# Patient Record
Sex: Female | Born: 1975
Health system: Southern US, Community
[De-identification: ages and names within clinical notes are randomized; demographics above are authoritative.]

## PROBLEM LIST (undated history)

## (undated) ENCOUNTER — Inpatient Hospital Stay (HOSPITAL_COMMUNITY): Payer: Self-pay

## (undated) DIAGNOSIS — D229 Melanocytic nevi, unspecified: Secondary | ICD-10-CM

## (undated) HISTORY — DX: Melanocytic nevi, unspecified: D22.9

---

## 2012-04-11 ENCOUNTER — Ambulatory Visit (INDEPENDENT_AMBULATORY_CARE_PROVIDER_SITE_OTHER): Payer: BC Managed Care – PPO | Admitting: Family Medicine

## 2012-04-11 VITALS — BP 120/80 | HR 84 | Temp 98.5°F | Resp 16 | Ht 62.0 in | Wt 173.0 lb

## 2012-04-11 DIAGNOSIS — R002 Palpitations: Secondary | ICD-10-CM

## 2012-04-11 DIAGNOSIS — R079 Chest pain, unspecified: Secondary | ICD-10-CM

## 2012-04-11 DIAGNOSIS — K219 Gastro-esophageal reflux disease without esophagitis: Secondary | ICD-10-CM

## 2012-04-11 LAB — COMPREHENSIVE METABOLIC PANEL WITH GFR
ALT: 15 U/L (ref 0–35)
CO2: 25 meq/L (ref 19–32)
Calcium: 9.8 mg/dL (ref 8.4–10.5)
Chloride: 102 meq/L (ref 96–112)
Sodium: 137 meq/L (ref 135–145)
Total Protein: 7.6 g/dL (ref 6.0–8.3)

## 2012-04-11 LAB — POCT CBC
Granulocyte percent: 80.5 %G — AB (ref 37–80)
HCT, POC: 46 % (ref 37.7–47.9)
Hemoglobin: 14.4 g/dL (ref 12.2–16.2)
Lymph, poc: 1.5 (ref 0.6–3.4)
MCH, POC: 30.4 pg (ref 27–31.2)
MCHC: 31.3 g/dL — AB (ref 31.8–35.4)
MCV: 97.2 fL — AB (ref 80–97)
MID (cbc): 0.4 (ref 0–0.9)
MPV: 8.4 fL (ref 0–99.8)
POC Granulocyte: 7.6 — AB (ref 2–6.9)
POC LYMPH PERCENT: 15.6 %L (ref 10–50)
POC MID %: 3.9 % (ref 0–12)
Platelet Count, POC: 301 10*3/uL (ref 142–424)
RBC: 4.73 M/uL (ref 4.04–5.48)
RDW, POC: 12.3 %
WBC: 9.5 10*3/uL (ref 4.6–10.2)

## 2012-04-11 LAB — COMPREHENSIVE METABOLIC PANEL
AST: 17 U/L (ref 0–37)
Albumin: 4.4 g/dL (ref 3.5–5.2)
Alkaline Phosphatase: 59 U/L (ref 39–117)
BUN: 9 mg/dL (ref 6–23)
Creat: 0.54 mg/dL (ref 0.50–1.10)
Glucose, Bld: 134 mg/dL — ABNORMAL HIGH (ref 70–99)
Potassium: 3.9 mEq/L (ref 3.5–5.3)
Total Bilirubin: 0.6 mg/dL (ref 0.3–1.2)

## 2012-04-11 MED ORDER — GI COCKTAIL ~~LOC~~
30.0000 mL | Freq: Once | ORAL | Status: AC
  Administered 2012-04-11: 30 mL via ORAL

## 2012-04-11 NOTE — Progress Notes (Signed)
[facility]:  Office Visit  Chief Complaint:  Chief Complaint  Patient presents with  . Heartburn    x 1 day  . Chest Pain    in the center of the chest  . Diarrhea    HPI: Jasmin Murphy is a 37 y.o. female who complains of  1 day history of stabbing pain mid chest, took zantac without relief. Is different then regular heart burn. Took peptobismol. Went to bed then couldn't lay down , took 3 tums. Went to bed at 2 AM. Had a burning sensation in [place] this AM when woke up. Gets worse and then better. Took nexium around noon. Only ate bland things. Had tacos last night, drank coffee in the afternoon, 2nd cup of the day which is not normal for her. Had palpitations and burning sensation going down body to feet and then had to go to have a bowel movement. Denies numbness, tingling. Denies nausea, vomiting, HA, vision changes. Denies any HTN, XOL, had gestational diabetes. Maternal grandfather with MI at age 19.   History reviewed. No pertinent past medical history. Past Surgical History  Procedure Date  . Cesarean section    History   Social History  . Marital Status: Married    Spouse Name: N/A    Number of Children: N/A  . Years of Education: N/A   Social History Main Topics  . Smoking status: Never Smoker   . Smokeless tobacco: None  . Alcohol Use: None  . Drug Use: None  . Sexually Active: None   Other Topics Concern  . None   Social History Narrative  . None   History reviewed. No pertinent family history. No Known Allergies Prior to Admission medications   Medication Sig Start Date End Date Taking? Authorizing Provider  PRENATAL VITAMINS PO Take by mouth.   Yes Historical Provider, MD  ranitidine (ZANTAC) 75 MG tablet Take 75 mg by mouth 2 (two) times daily.   Yes Historical Provider, MD     ROS: The patient denies fevers, chills, night sweats, unintentional weight loss, wheezing, dyspnea on exertion, nausea, vomiting, abdominal pain,  dysuria, hematuria, melena, numbness, weakness, or tingling.   All other systems have been reviewed and were otherwise negative with the exception of those mentioned in the HPI and as above.    PHYSICAL EXAM: Filed Vitals:   04/11/12 1450  BP: 120/80  Pulse: 84  Temp: 98.5 F (36.9 C)  Resp: 16   Filed Vitals:   04/11/12 1450  Height: 5\' 2"  (1.575 m)  Weight: 173 lb (78.472 kg)   Body mass index is 31.64 kg/(m^2).  General: Alert, no acute distress HEENT:  Normocephalic, atraumatic, oropharynx patent.  Cardiovascular:  Regular rate and rhythm, no rubs murmurs or gallops.  No Carotid bruits, radial pulse intact. No pedal edema.  Respiratory: Clear to auscultation bilaterally.  No wheezes, rales, or rhonchi.  No cyanosis, no use of accessory musculature GI: No organomegaly, abdomen is soft and non-tender, positive bowel sounds.  No masses. Skin: No rashes. Neurologic: Facial musculature symmetric. Psychiatric: Patient is appropriate throughout our interaction. Lymphatic: No cervical lymphadenopathy Musculoskeletal: Gait intact.   LABS: Results for orders placed in visit on 04/11/12  POCT CBC      Component Value Range   WBC 9.5  4.6 - 10.2 K/uL   Lymph, poc 1.5  0.6 - 3.4   POC LYMPH PERCENT 15.6  10 - 50 %L   MID (cbc) 0.4  0 - 0.9  POC MID % 3.9  0 - 12 %M   POC Granulocyte 7.6 (*) 2 - 6.9   Granulocyte percent 80.5 (*) 37 - 80 %G   RBC 4.73  4.04 - 5.48 M/uL   Hemoglobin 14.4  12.2 - 16.2 g/dL   HCT, POC 16.1  09.6 - 47.9 %   MCV 97.2 (*) 80 - 97 fL   MCH, POC 30.4  27 - 31.2 pg   MCHC 31.3 (*) 31.8 - 35.4 g/dL   RDW, POC 04.5     Platelet Count, POC 301  142 - 424 K/uL   MPV 8.4  0 - 99.8 fL     EKG/XRAY:   Primary read interpreted by Dr. Conley Rolls at [facility]. EKG NSR at 70 bpm   ASSESSMENT/PLAN: Encounter Diagnoses  Name Primary?  . Chest pain Yes  . Palpitations   . GERD (gastroesophageal reflux disease)    Midsubsternal CP most c/w GERD. Was given GI  cocktail x 1 and felt better. But it was getting better at the time anyways, possibly due to nexium that she took at noon.  Rx Prilosec 20 mg and Zantac prn Avoid GERD causes F/u prn Go to ER if worsening sxs, CP, SOB    Cathan Gearin PHUONG, DO 04/11/2012 3:47 PM

## 2012-12-18 ENCOUNTER — Inpatient Hospital Stay (HOSPITAL_COMMUNITY): Payer: BC Managed Care – PPO

## 2012-12-18 ENCOUNTER — Inpatient Hospital Stay (HOSPITAL_COMMUNITY)
Admission: AD | Admit: 2012-12-18 | Discharge: 2012-12-18 | Disposition: A | Discharge status: Home / Self Care | Payer: BC Managed Care – PPO | Source: Ambulatory Visit | Attending: Obstetrics & Gynecology | Admitting: Obstetrics & Gynecology

## 2012-12-18 ENCOUNTER — Encounter (HOSPITAL_COMMUNITY): Payer: Self-pay

## 2012-12-18 DIAGNOSIS — O009 Unspecified ectopic pregnancy without intrauterine pregnancy: Secondary | ICD-10-CM

## 2012-12-18 DIAGNOSIS — R109 Unspecified abdominal pain: Secondary | ICD-10-CM | POA: Insufficient documentation

## 2012-12-18 DIAGNOSIS — M545 Low back pain, unspecified: Secondary | ICD-10-CM | POA: Insufficient documentation

## 2012-12-18 DIAGNOSIS — O00109 Unspecified tubal pregnancy without intrauterine pregnancy: Secondary | ICD-10-CM | POA: Insufficient documentation

## 2012-12-18 LAB — URINE MICROSCOPIC-ADD ON

## 2012-12-18 LAB — WET PREP, GENITAL
Clue Cells Wet Prep HPF POC: NONE SEEN
Trich, Wet Prep: NONE SEEN
Yeast Wet Prep HPF POC: NONE SEEN

## 2012-12-18 LAB — URINALYSIS, ROUTINE W REFLEX MICROSCOPIC
Bilirubin Urine: NEGATIVE
Nitrite: NEGATIVE
Specific Gravity, Urine: >1.03 — ABNORMAL HIGH (ref 1.005–1.030)
Urobilinogen, UA: 0.2 mg/dL (ref 0.0–1.0)
pH: 5.5 (ref 5.0–8.0)

## 2012-12-18 LAB — COMPREHENSIVE METABOLIC PANEL
ALT: 10 U/L (ref 0–35)
AST: 14 U/L (ref 0–37)
Albumin: 4 g/dL (ref 3.5–5.2)
CO2: 25 mEq/L (ref 19–32)
Calcium: 9.3 mg/dL (ref 8.4–10.5)
Chloride: 101 mEq/L (ref 96–112)
Creatinine, Ser: 0.59 mg/dL (ref 0.50–1.10)
GFR calc non Af Amer: >90 mL/min (ref >90)
Potassium: 4.7 mEq/L (ref 3.5–5.1)
Sodium: 137 mEq/L (ref 135–145)
Total Bilirubin: 0.4 mg/dL (ref 0.3–1.2)
Total Protein: 7 g/dL (ref 6.0–8.3)

## 2012-12-18 LAB — CBC
HCT: 37.8 % (ref 36.0–46.0)
MCH: 31.8 pg (ref 26.0–34.0)
MCHC: 33.9 g/dL (ref 30.0–36.0)
MCV: 93.8 fL (ref 78.0–100.0)
RBC: 4.03 MIL/uL (ref 3.87–5.11)
RDW: 11.9 % (ref 11.5–15.5)

## 2012-12-18 LAB — POCT PREGNANCY, URINE: Preg Test, Ur: POSITIVE — AB

## 2012-12-18 LAB — HCG, QUANTITATIVE, PREGNANCY: hCG, Beta Chain, Quant, S: 874 m[IU]/mL — ABNORMAL HIGH

## 2012-12-18 LAB — ABO/RH: ABO/RH(D): O POS

## 2012-12-18 MED ORDER — OXYCODONE-ACETAMINOPHEN 5-325 MG PO TABS: 2.0000 | ORAL_TABLET | Freq: Once | ORAL | Status: DC

## 2012-12-18 MED ORDER — OXYCODONE-ACETAMINOPHEN 5-325 MG PO TABS: 1.0000 | ORAL_TABLET | ORAL | Status: DC | PRN

## 2012-12-18 MED ORDER — METHOTREXATE INJECTION FOR WOMEN'S HOSPITAL
50.0000 mg/m2 | Freq: Once | INTRAMUSCULAR | Status: AC
  Administered 2012-12-18: 85 mg via INTRAMUSCULAR
  Filled 2012-12-18: qty 1.7

## 2012-12-18 NOTE — MAU Note (Signed)
DENIES ANY ALLERGIC REACTION- NONE REPORTED.

## 2012-12-18 NOTE — MAU Note (Signed)
Complains of vaginal bleeding x1 day with cramping and severe back pain.

## 2012-12-18 NOTE — MAU Note (Signed)
G2P1, [redacted] weeks gestation and 2 weeks s/p positive urine home pregnancy test, presents to MAU with severe, intermittent left lower quadrant pain.  Evaluated by MAU NP who diagnosed left ectopic pregnancy based on an ultrasound that showed a 2.5 cm mass in the left adnexa.  Her HCG was 874 and her hemoglobin was 12.8 gm.  Based on the ultrasound findings, the severe left lower quadrant pain and tenderness, and the history of a positive UCG 14 days ago, I felt that the diagnosis of viable intrauterine pregnancy had been ruled out.  I discussed this last point as well as the other risks and benefits associated with Methotrexate therapy with the patient by phone and answered all her questions.  She will receive MTX now and return to the office for follow up quants on 9/29.

## 2012-12-18 NOTE — MAU Provider Note (Signed)
Chief Complaint: Vaginal Bleeding   First Provider Initiated Contact with Patient 12/18/12 0341     SUBJECTIVE HPI: Jasmin Murphy is a 37 y.o. G2P1001 at [redacted]w[redacted]d by LMP who presents with left lower quadrant cramping and spotting since last night and bilateral low back pain x4 days. No relief with Tylenol. Positive home UPT two weeks ago, but no other testing this pregnancy. States she called the after-hours number and was told to come to maternity admissions.  History reviewed. No pertinent past medical history. OB History  Gravida Para Term Preterm AB SAB TAB Ectopic Multiple Living  2 1 1       1     # Outcome Date GA Lbr Len/2nd Weight Sex Delivery Anes PTL Lv  2 CUR           1 TRM 09/04/09 [redacted]w[redacted]d   F LTCS   Y     Past Surgical History  Procedure Laterality Date  . Cesarean section     History   Social History  . Marital Status: Married    Spouse Name: N/A    Number of Children: N/A  . Years of Education: N/A   Occupational History  . Not on file.   Social History Main Topics  . Smoking status: Never Smoker   . Smokeless tobacco: Not on file  . Alcohol Use: Yes  . Drug Use: No  . Sexual Activity: Yes   Other Topics Concern  . Not on file   Social History Narrative  . No narrative on file   No current facility-administered medications on file prior to encounter.   Current Outpatient Prescriptions on File Prior to Encounter  Medication Sig Dispense Refill  . PRENATAL VITAMINS PO Take by mouth.      . ranitidine (ZANTAC) 75 MG tablet Take 75 mg by mouth 2 (two) times daily.       Allergies  Allergen Reactions  . Latex Rash    ROS: Pertinent positive items in HPI. Negative for fever, chills, passage of tissue, urinary complaints or GI complaints.  OBJECTIVE Blood pressure 98/52, pulse 71, temperature 98.5 F (36.9 C), temperature source Oral, resp. rate 18, height 5\' 3"  (1.6 m), weight 61.417 kg (135 lb 6.4 oz), last menstrual period 11/03/2012. GENERAL:  Well-developed, well-nourished female in mild distress. Anxious and tearful. HEENT: Normocephalic HEART: normal rate RESP: normal effort ABDOMEN: Soft, moderate left lower quadrant tenderness. Positive guarding. Positive bowel sounds x4. No CVAT. Low back NT.  EXTREMITIES: Nontender, no edema NEURO: Alert and oriented SPECULUM EXAM: NEFG, small amount of bloody discharge from os, normal odor, cervix clean BIMANUAL: cervix closed; uterus normal size, moderate left adnexal tenderness. No mass. No right adnexal tenderness or mass. No CMT.  LAB RESULTS Results for orders placed during the hospital encounter of 12/18/12 (from the past 24 hour(s))  URINALYSIS, ROUTINE W REFLEX MICROSCOPIC     Status: Abnormal   Collection Time    12/18/12  3:03 AM      Result Value Range   Color, Urine YELLOW  YELLOW   APPearance CLEAR  CLEAR   Specific Gravity, Urine >1.030 (*) 1.005 - 1.030   pH 5.5  5.0 - 8.0   Glucose, UA NEGATIVE  NEGATIVE mg/dL   Hgb urine dipstick MODERATE (*) NEGATIVE   Bilirubin Urine NEGATIVE  NEGATIVE   Ketones, ur NEGATIVE  NEGATIVE mg/dL   Protein, ur NEGATIVE  NEGATIVE mg/dL   Urobilinogen, UA 0.2  0.0 - 1.0 mg/dL   Nitrite NEGATIVE  NEGATIVE   Leukocytes, UA NEGATIVE  NEGATIVE  URINE MICROSCOPIC-ADD ON     Status: Abnormal   Collection Time    12/18/12  3:03 AM      Result Value Range   Squamous Epithelial / LPF FEW (*) RARE   WBC, UA 0-2  <3 WBC/hpf   RBC / HPF 3-6  <3 RBC/hpf   Bacteria, UA FEW (*) RARE  POCT PREGNANCY, URINE     Status: Abnormal   Collection Time    12/18/12  3:20 AM      Result Value Range   Preg Test, Ur POSITIVE (*) NEGATIVE  HCG, QUANTITATIVE, PREGNANCY     Status: Abnormal   Collection Time    12/18/12  4:05 AM      Result Value Range   hCG, Beta Chain, Quant, S 874 (*) <5 mIU/mL  ABO/RH     Status: None   Collection Time    12/18/12  4:05 AM      Result Value Range   ABO/RH(D) O POS    CBC     Status: None   Collection Time     12/18/12  4:05 AM      Result Value Range   WBC 9.9  4.0 - 10.5 K/uL   RBC 4.03  3.87 - 5.11 MIL/uL   Hemoglobin 12.8  12.0 - 15.0 g/dL   HCT 08.6  57.8 - 46.9 %   MCV 93.8  78.0 - 100.0 fL   MCH 31.8  26.0 - 34.0 pg   MCHC 33.9  30.0 - 36.0 g/dL   RDW 62.9  52.8 - 41.3 %   Platelets 204  150 - 400 K/uL  COMPREHENSIVE METABOLIC PANEL     Status: Abnormal   Collection Time    12/18/12  4:05 AM      Result Value Range   Sodium 137  135 - 145 mEq/L   Potassium 4.7  3.5 - 5.1 mEq/L   Chloride 101  96 - 112 mEq/L   CO2 25  19 - 32 mEq/L   Glucose, Bld 116 (*) 70 - 99 mg/dL   BUN 10  6 - 23 mg/dL   Creatinine, Ser 2.44  0.50 - 1.10 mg/dL   Calcium 9.3  8.4 - 01.0 mg/dL   Total Protein 7.0  6.0 - 8.3 g/dL   Albumin 4.0  3.5 - 5.2 g/dL   AST 14  0 - 37 U/L   ALT 10  0 - 35 U/L   Alkaline Phosphatase 52  39 - 117 U/L   Total Bilirubin 0.4  0.3 - 1.2 mg/dL   GFR calc non Af Amer >90  >90 mL/min   GFR calc Af Amer >90  >90 mL/min  WET PREP, GENITAL     Status: Abnormal   Collection Time    12/18/12  5:05 AM      Result Value Range   Yeast Wet Prep HPF POC NONE SEEN  NONE SEEN   Trich, Wet Prep NONE SEEN  NONE SEEN   Clue Cells Wet Prep HPF POC NONE SEEN  NONE SEEN   WBC, Wet Prep HPF POC FEW (*) NONE SEEN    IMAGING US Ob Comp Less 14 Wks  12/18/2012   CLINICAL DATA:  Left-sided pelvic pain and spotting a pregnant patient. Quantitative HCG 874.  EXAM: TRANSVAGINAL OB ULTRASOUND; OBSTETRIC <14 WK ULTRASOUND  TECHNIQUE: Transvaginal ultrasound was performed for complete evaluation of the gestation as well as the maternal  uterus, adnexal regions, and pelvic cul-de-sac.  COMPARISON:  None.  FINDINGS: Intrauterine gestational sac: None visualized.  Yolk sac:  None visualized.  Embryo:  None visualized.  Cardiac Activity: Not detected.  Maternal uterus/adnexae: In the left adnexa adjacent to the left ovary, there is a 2.4 x 2.4 x 2.9 cm peripherally echogenic structure with a small area of  central hypo echogenicity. There is a moderate volume of complex free pelvic fluid present.  IMPRESSION: Findings highly suspicious for ectopic pregnancy on the left.  Critical Value/emergent results were called by telephone at the time of interpretation on 12/18/2012 at 5:10 [facility] , who verbally acknowledged these results.   Electronically Signed   By: Drusilla Kanner M.D.   On: 12/18/2012 05:14   US Ob Transvaginal  12/18/2012   CLINICAL DATA:  Left-sided pelvic pain and spotting a pregnant patient. Quantitative HCG 874.  EXAM: TRANSVAGINAL OB ULTRASOUND; OBSTETRIC <14 WK ULTRASOUND  TECHNIQUE: Transvaginal ultrasound was performed for complete evaluation of the gestation as well as the maternal uterus, adnexal regions, and pelvic cul-de-sac.  COMPARISON:  None.  FINDINGS: Intrauterine gestational sac: None visualized.  Yolk sac:  None visualized.  Embryo:  None visualized.  Cardiac Activity: Not detected.  Maternal uterus/adnexae: In the left adnexa adjacent to the left ovary, there is a 2.4 x 2.4 x 2.9 cm peripherally echogenic structure with a small area of central hypo echogenicity. There is a moderate volume of complex free pelvic fluid present.  IMPRESSION: Findings highly suspicious for ectopic pregnancy on the left.  Critical Value/emergent results were called by telephone at the time of interpretation on 12/18/2012 at 5:10 [facility] , who verbally acknowledged these results.   Electronically Signed   By: Drusilla Kanner M.D.   On: 12/18/2012 05:14   MAU COURSE Discussed US showing left ectopic and pt's pain and bleeding w/ Dr. Arlyce Dice. Reviewed [place] and labs remotely. Discussed Dx w/ pt by phone. Recommends MTX. See note. Pt agrees. Questions addressed by Dr. Arlyce Dice and CNM.    MTX given.   ASSESSMENT 1. Ectopic pregnancy    PLAN Discharge home in stable condition per consult w/ Dr. Arlyce Dice.  Support given. Ectopic precautions reviewed. Pelvic rest.      Follow-up  Information   Follow up with [facility] On 12/23/2012. (for blood work or early as needed if symptoms worsen)    Contact information:   [address] [place] [address] [phone]      Follow up with [facility]. (As needed if symptoms worsen)    Contact information:   [address] [id number] [place] [address] [phone]       Medication List    STOP taking these medications       PRENATAL VITAMINS PO      TAKE these medications       acetaminophen 325 MG tablet  Commonly known as:  TYLENOL  Take 650 mg by mouth every 6 (six) hours as needed for pain.     oxyCODONE-acetaminophen 5-325 MG per tablet  Commonly known as:  PERCOCET/ROXICET  Take 1-2 tablets by mouth every 4 (four) hours as needed for pain.     ranitidine 75 MG tablet  Commonly known as:  ZANTAC  Take 75 mg by mouth 2 (two) times daily.     simethicone 125 MG chewable tablet  Commonly known as:  MYLICON  Chew 125 mg  by mouth every 6 (six) hours as needed for flatulence.       [place], [place] 12/18/2012  6:35 AM

## 2012-12-19 LAB — GC/CHLAMYDIA PROBE AMP: GC Probe RNA: NEGATIVE

## 2013-01-08 ENCOUNTER — Telehealth: Payer: Self-pay

## 2013-01-08 NOTE — Telephone Encounter (Signed)
PT WOULD LIKE TO KNOW WHAT HER THYROID LEVEL IS. HADN'T HEARD FROM ANYONE. PLEASE CALL [phone]

## 2013-01-08 NOTE — Telephone Encounter (Signed)
We have not checked her thyroid. She has not been in since Jan. She indicates she thought is was done in Jan, advised her it was not.

## 2013-02-26 ENCOUNTER — Other Ambulatory Visit (HOSPITAL_COMMUNITY): Payer: Self-pay | Admitting: Obstetrics and Gynecology

## 2013-02-26 DIAGNOSIS — O009 Unspecified ectopic pregnancy without intrauterine pregnancy: Secondary | ICD-10-CM

## 2013-03-13 ENCOUNTER — Ambulatory Visit (HOSPITAL_COMMUNITY): Payer: BC Managed Care – PPO

## 2013-03-17 ENCOUNTER — Ambulatory Visit (HOSPITAL_COMMUNITY): Payer: BC Managed Care – PPO

## 2013-08-08 DIAGNOSIS — D229 Melanocytic nevi, unspecified: Secondary | ICD-10-CM

## 2013-08-08 HISTORY — DX: Melanocytic nevi, unspecified: D22.9

## 2013-10-23 ENCOUNTER — Encounter (HOSPITAL_COMMUNITY): Payer: Self-pay | Admitting: *Deleted

## 2014-01-26 ENCOUNTER — Encounter (HOSPITAL_COMMUNITY): Payer: Self-pay | Admitting: *Deleted

## 2014-10-28 ENCOUNTER — Other Ambulatory Visit: Payer: Self-pay | Admitting: Certified Nurse Midwife

## 2014-10-28 DIAGNOSIS — R599 Enlarged lymph nodes, unspecified: Secondary | ICD-10-CM

## 2014-11-09 ENCOUNTER — Ambulatory Visit
Admission: RE | Admit: 2014-11-09 | Discharge: 2014-11-09 | Disposition: A | Discharge status: Home / Self Care | Payer: BLUE CROSS/BLUE SHIELD | Source: Ambulatory Visit | Attending: Certified Nurse Midwife | Admitting: Certified Nurse Midwife

## 2014-11-09 DIAGNOSIS — R599 Enlarged lymph nodes, unspecified: Secondary | ICD-10-CM

## 2014-12-07 ENCOUNTER — Ambulatory Visit (INDEPENDENT_AMBULATORY_CARE_PROVIDER_SITE_OTHER): Payer: BLUE CROSS/BLUE SHIELD

## 2014-12-07 ENCOUNTER — Other Ambulatory Visit: Payer: Self-pay | Admitting: Urgent Care

## 2014-12-07 ENCOUNTER — Ambulatory Visit (INDEPENDENT_AMBULATORY_CARE_PROVIDER_SITE_OTHER): Payer: BLUE CROSS/BLUE SHIELD | Admitting: Urgent Care

## 2014-12-07 VITALS — BP 104/62 | HR 77 | Temp 98.2°F | Resp 16 | Ht 63.0 in | Wt 144.0 lb

## 2014-12-07 DIAGNOSIS — R946 Abnormal results of thyroid function studies: Secondary | ICD-10-CM | POA: Diagnosis not present

## 2014-12-07 DIAGNOSIS — R109 Unspecified abdominal pain: Secondary | ICD-10-CM | POA: Insufficient documentation

## 2014-12-07 DIAGNOSIS — M25551 Pain in right hip: Secondary | ICD-10-CM | POA: Diagnosis not present

## 2014-12-07 DIAGNOSIS — R7989 Other specified abnormal findings of blood chemistry: Secondary | ICD-10-CM

## 2014-12-07 DIAGNOSIS — M545 Low back pain, unspecified: Secondary | ICD-10-CM | POA: Insufficient documentation

## 2014-12-07 DIAGNOSIS — Z87440 Personal history of urinary (tract) infections: Secondary | ICD-10-CM | POA: Diagnosis not present

## 2014-12-07 DIAGNOSIS — Z8759 Personal history of other complications of pregnancy, childbirth and the puerperium: Secondary | ICD-10-CM | POA: Diagnosis not present

## 2014-12-07 LAB — POCT CBC
GRANULOCYTE PERCENT: 86.1 % — AB (ref 37–80)
HCT, POC: 41.7 % (ref 37.7–47.9)
Hemoglobin: 13.2 g/dL (ref 12.2–16.2)
LYMPH, POC: 1 (ref 0.6–3.4)
MCH, POC: 29.7 pg (ref 27–31.2)
MCHC: 31.7 g/dL — AB (ref 31.8–35.4)
MCV: 93.6 fL (ref 80–97)
MID (CBC): 0.2 (ref 0–0.9)
MPV: 7.5 fL (ref 0–99.8)
POC GRANULOCYTE: 7.1 — AB (ref 2–6.9)
POC LYMPH %: 11.9 % (ref 10–50)
POC MID %: 2 %M (ref 0–12)
Platelet Count, POC: 254 10*3/uL (ref 142–424)
RBC: 4.46 M/uL (ref 4.04–5.48)
RDW, POC: 13 %
WBC: 8.3 10*3/uL (ref 4.6–10.2)

## 2014-12-07 LAB — THYROID PANEL WITH TSH
FREE THYROXINE INDEX: 1.8 (ref 1.4–3.8)
T3 Uptake: 26 % (ref 22–35)
T4, Total: 6.8 ug/dL (ref 4.5–12.0)
TSH: 5.552 u[IU]/mL — AB (ref 0.350–4.500)

## 2014-12-07 LAB — COMPREHENSIVE METABOLIC PANEL
ALT: 12 U/L (ref 6–29)
AST: 16 U/L (ref 10–30)
Albumin: 4.5 g/dL (ref 3.6–5.1)
Alkaline Phosphatase: 43 U/L (ref 33–115)
BUN: 7 mg/dL (ref 7–25)
CALCIUM: 9.9 mg/dL (ref 8.6–10.2)
CO2: 28 mmol/L (ref 20–31)
Chloride: 102 mmol/L (ref 98–110)
Creat: 0.63 mg/dL (ref 0.50–1.10)
Glucose, Bld: 134 mg/dL — ABNORMAL HIGH (ref 65–99)
POTASSIUM: 4.5 mmol/L (ref 3.5–5.3)
Sodium: 140 mmol/L (ref 135–146)
Total Bilirubin: 0.7 mg/dL (ref 0.2–1.2)
Total Protein: 7.6 g/dL (ref 6.1–8.1)

## 2014-12-07 LAB — POCT URINE PREGNANCY: Preg Test, Ur: NEGATIVE

## 2014-12-07 LAB — POCT UA - MICROSCOPIC ONLY
CASTS, UR, LPF, POC: NEGATIVE
CRYSTALS, UR, HPF, POC: NEGATIVE
Mucus, UA: NEGATIVE
RBC, urine, microscopic: NEGATIVE
YEAST UA: NEGATIVE

## 2014-12-07 LAB — POCT URINALYSIS DIPSTICK
BILIRUBIN UA: NEGATIVE
Blood, UA: NEGATIVE
Glucose, UA: NEGATIVE
KETONES UA: NEGATIVE
LEUKOCYTES UA: NEGATIVE
Nitrite, UA: NEGATIVE
PH UA: 6.5
Protein, UA: NEGATIVE
SPEC GRAV UA: 1.01
Urobilinogen, UA: 0.2

## 2014-12-07 MED ORDER — CYCLOBENZAPRINE HCL 10 MG PO TABS: 5.0000 mg | ORAL_TABLET | Freq: Three times a day (TID) | ORAL | Status: DC | PRN

## 2014-12-07 MED ORDER — MELOXICAM 7.5 MG PO TABS: 7.5000 mg | ORAL_TABLET | Freq: Every day | ORAL | Status: DC

## 2014-12-07 NOTE — Progress Notes (Signed)
MRN: [id number] DOB: 07-14-1975  Subjective:   Jasmin Murphy is a 39 y.o. female presenting for chief complaint of Back Pain and pt was treated for UTI 4 weeks ago  Reports ~3 weeks of intermittent daily achy, right-sided low back and flank pain. Pain is non-radiating and occurs randomly. Patient was treated for UTI with Septra, then switched to Macrobid ~4 weeks ago. Shortly after she completed her antibiotics, patient had 1 episode of bloody vaginal discharge and thought it could have been pregnancy. She took a UPT and was negative. Her last menstrualy cycle was last week 11/30/2014, was normal. Has not tried any medications for relief. Denies fever, chills, n/v, abdominal pain, diarrhea, constipation, hematuria, dysuria, genital rashes, fatigue, thinning of hair, cold intolerance, depressed mood. Admits a history of elevated TSH, referred to endocrinology but has not followed up. Denies smoking cigarettes, drinks socially. Denies any other aggravating or relieving factors, no other questions or concerns.  Jasmin Murphy has a current medication list which includes the following prescription(s): acetaminophen and ranitidine. Also is allergic to latex.  Jasmin Murphy  has no past medical history on file. Also  has past surgical history that includes Cesarean section.  Objective:   Vitals: BP 104/62 mmHg  Pulse 77  Temp(Src) 98.2 F (36.8 C) (Oral)  Resp 16  Ht 5\' 3"  (1.6 m)  Wt 144 lb (65.318 kg)  BMI 25.51 kg/m2  SpO2 99%  Physical Exam  Constitutional: She is oriented to person, place, and time. She appears well-developed and well-nourished.  HENT:  Mouth/Throat: Oropharynx is clear and moist.  Eyes: No scleral icterus.  Neck: Normal range of motion. Neck supple. No thyromegaly present.  Cardiovascular: Normal rate, regular rhythm and intact distal pulses.  Exam reveals no gallop and no friction rub.   Murmur (Grade I/VI systolic murmur best heard over LUSB) heard. Pulmonary/Chest: No  respiratory distress. She has no wheezes. She has no rales.  Abdominal: Soft. Bowel sounds are normal. She exhibits no distension and no mass. There is no tenderness.  Musculoskeletal: She exhibits no edema (peripheral).       Right hip: She exhibits tenderness (over area depicted). She exhibits normal range of motion, normal strength, no swelling, no crepitus, no deformity and no laceration.       Legs: Neurological: She is alert and oriented to person, place, and time.  Skin: Skin is warm and dry. No rash noted. No erythema. No pallor.   Results for orders placed or performed in visit on 12/07/14 (from the past 24 hour(s))  POCT UA - Microscopic Only     Status: None   Collection Time: 12/07/14  2:10 PM  Result Value Ref Range   WBC, Ur, HPF, POC 0-2    RBC, urine, microscopic neg    Bacteria, U Microscopic trace    Mucus, UA neg    Epithelial cells, urine per micros 0-2    Crystals, Ur, HPF, POC neg    Casts, Ur, LPF, POC neg    Yeast, UA neg   POCT urinalysis dipstick     Status: None   Collection Time: 12/07/14  2:10 PM  Result Value Ref Range   Color, UA yellow    Clarity, UA clear    Glucose, UA neg    Bilirubin, UA neg    Ketones, UA neg    Spec Grav, UA 1.010    Blood, UA neg    pH, UA 6.5    Protein, UA neg    Urobilinogen,  UA 0.2    Nitrite, UA neg    Leukocytes, UA Negative Negative  POCT urine pregnancy     Status: None   Collection Time: 12/07/14  2:20 PM  Result Value Ref Range   Preg Test, Ur Negative Negative   [facility] reading (PRIMARY) by  Dr. Ouida Sills and PA-Ehren Berisha. Right Hip - normal.  Assessment and Plan :   1. Flank pain 2. Right-sided low back pain without sciatica 3. Right hip pain - Labs pending - May be more musculoskeletal pain, start Flexeril and meloxicam, follow up by phone tomorrow with results and will recommend patient continue trial of NSAID and relaxant for 1 week before re-evaluation if everything is normal. - DG HIP UNILAT W OR W/O  PELVIS 2-3 VIEWS RIGHT; Future  4. History of UTI - Resolved from a few weeks ago - POCT UA - Microscopic Only - POCT urinalysis dipstick - Comprehensive metabolic panel  5. History of ectopic pregnancy - Ruled out pregnancy today - POCT urine pregnancy  6. Elevated TSH - History of elevated TSH, may be source for multiple symptoms, labs pending - Thyroid Panel With TSH   Jaynee Eagles, PA-C [facility] [phone] 12/07/2014 1:53 PM

## 2014-12-07 NOTE — Patient Instructions (Signed)
Flank Pain Flank pain refers to pain that is located on the side of the body between the upper abdomen and the back. The pain may occur over a short period of time (acute) or may be long-term or reoccurring (chronic). It may be mild or severe. Flank pain can be caused by many things. CAUSES  Some of the more common causes of flank pain include:  Muscle strains.   Muscle spasms.   A disease of your spine (vertebral disk disease).   A lung infection (pneumonia).   Fluid around your lungs (pulmonary edema).   A kidney infection.   Kidney stones.   A very painful skin rash caused by the chickenpox virus (shingles).   Gallbladder disease.  [place] care will depend on the cause of your pain. In general,  Rest as directed by your caregiver.  Drink enough fluids to keep your urine clear or pale yellow.  Only take over-the-counter or prescription medicines as directed by your caregiver. Some medicines may help relieve the pain.  Tell your caregiver about any changes in your pain.  Follow up with your caregiver as directed. SEEK IMMEDIATE MEDICAL CARE IF:   Your pain is not controlled with medicine.   You have new or worsening symptoms.  Your pain increases.   You have abdominal pain.   You have shortness of breath.   You have persistent nausea or vomiting.   You have swelling in your abdomen.   You feel faint or pass out.   You have blood in your urine.  You have a fever or persistent symptoms for more than 2-3 days.  You have a fever and your symptoms suddenly get worse. MAKE SURE YOU:   Understand these instructions.  Will watch your condition.  Will get help right away if you are not doing well or get worse. Document Released: 05/04/2005 Document Revised: 12/06/2011 Document Reviewed: 10/26/2011 [facility] Patient Information 2015 [place], [place]. This information is not intended to replace advice given to you by your  health care provider. Make sure you discuss any questions you have with your health care provider.   Hip Pain Your hip is the joint between your upper legs and your lower pelvis. The bones, cartilage, tendons, and muscles of your hip joint perform a lot of work each day supporting your body weight and allowing you to move around. Hip pain can range from a minor ache to severe pain in one or both of your hips. Pain may be felt on the inside of the hip joint near the groin, or the outside near the buttocks and upper thigh. You may have swelling or stiffness as well.  HOME CARE INSTRUCTIONS   Take medicines only as directed by your health care provider.  Apply ice to the injured area:  Put ice in a plastic bag.  Place a towel between your skin and the bag.  Leave the ice on for 15-20 minutes at a time, 3-4 times a day.  Keep your leg raised (elevated) when possible to lessen swelling.  Avoid activities that cause pain.  Follow specific exercises as directed by your health care provider.  Sleep with a pillow between your legs on your most comfortable side.  Record how often you have hip pain, the location of the pain, and what it feels like. SEEK MEDICAL CARE IF:   You are unable to put weight on your leg.  Your hip is red or swollen or very tender to touch.  Your pain or swelling continues or worsens after 1 week.  You have increasing difficulty walking.  You have a fever. SEEK IMMEDIATE MEDICAL CARE IF:   You have fallen.  You have a sudden increase in pain and swelling in your hip. MAKE SURE YOU:   Understand these instructions.  Will watch your condition.  Will get help right away if you are not doing well or get worse. Document Released: 08/31/2009 Document Revised: 07/28/2013 Document Reviewed: 11/07/2012 [facility] Patient Information 2015 [place], [place]. This information is not intended to replace advice given to you by your health care provider. Make sure you  discuss any questions you have with your health care provider.

## 2014-12-08 ENCOUNTER — Telehealth: Payer: Self-pay

## 2014-12-08 NOTE — Telephone Encounter (Signed)
Pt called wanting her labs. Says she was told she would hear something today. Please advise. Thanks

## 2014-12-08 NOTE — Telephone Encounter (Signed)
Pt called about clinical results from recent visit... Pt disconnected the line before I could follow up. [employer] issued documents to be mailed. PT should receive within 7-10 business days.

## 2014-12-08 NOTE — Telephone Encounter (Signed)
Reported normal electrolytes, liver enzymes, kidney function, thyroid hormones. Her TSH is slightly elevated and CMet was non-fasting but patient wanted to make sure A1c was checked to rule out diabetes. Patient does admit that she has had a previously elevated TSH with normal thyroid hormones. No further work-up was done. She did take meloxicam yesterday and notes improvement in her flank and low back/right hip pain. She did not take flexeril because she was worried that it could affect her thyroid. I will follow up with her by phone again tomorrow regarding her A1c. She is to rtc tomorrow night or Thursday night if she has no improvement in her symptoms.

## 2014-12-09 LAB — HEMOGLOBIN A1C
Hgb A1c MFr Bld: 5.2 % (ref <5.7)
Mean Plasma Glucose: 103 mg/dL (ref <117)

## 2015-01-16 NOTE — Progress Notes (Signed)
  Medical screening examination/treatment/procedure(s) were performed by non-physician practitioner and as supervising physician I was immediately available for consultation/collaboration.     

## 2015-02-12 DIAGNOSIS — D229 Melanocytic nevi, unspecified: Secondary | ICD-10-CM

## 2015-02-12 HISTORY — DX: Melanocytic nevi, unspecified: D22.9

## 2015-07-13 DIAGNOSIS — M25511 Pain in right shoulder: Secondary | ICD-10-CM | POA: Diagnosis not present

## 2015-07-13 DIAGNOSIS — S43001A Unspecified subluxation of right shoulder joint, initial encounter: Secondary | ICD-10-CM | POA: Diagnosis not present

## 2015-08-03 DIAGNOSIS — M25551 Pain in right hip: Secondary | ICD-10-CM | POA: Diagnosis not present

## 2015-08-03 DIAGNOSIS — M25511 Pain in right shoulder: Secondary | ICD-10-CM | POA: Diagnosis not present

## 2015-08-05 DIAGNOSIS — M25511 Pain in right shoulder: Secondary | ICD-10-CM | POA: Diagnosis not present

## 2015-08-05 DIAGNOSIS — S43001D Unspecified subluxation of right shoulder joint, subsequent encounter: Secondary | ICD-10-CM | POA: Diagnosis not present

## 2015-08-10 DIAGNOSIS — S43001D Unspecified subluxation of right shoulder joint, subsequent encounter: Secondary | ICD-10-CM | POA: Diagnosis not present

## 2015-08-10 DIAGNOSIS — M25511 Pain in right shoulder: Secondary | ICD-10-CM | POA: Diagnosis not present

## 2015-08-17 DIAGNOSIS — M25511 Pain in right shoulder: Secondary | ICD-10-CM | POA: Diagnosis not present

## 2015-08-17 DIAGNOSIS — S43001D Unspecified subluxation of right shoulder joint, subsequent encounter: Secondary | ICD-10-CM | POA: Diagnosis not present

## 2015-08-25 DIAGNOSIS — M25511 Pain in right shoulder: Secondary | ICD-10-CM | POA: Diagnosis not present

## 2015-08-25 DIAGNOSIS — S43001D Unspecified subluxation of right shoulder joint, subsequent encounter: Secondary | ICD-10-CM | POA: Diagnosis not present

## 2015-08-31 DIAGNOSIS — M25511 Pain in right shoulder: Secondary | ICD-10-CM | POA: Diagnosis not present

## 2015-08-31 DIAGNOSIS — S43001D Unspecified subluxation of right shoulder joint, subsequent encounter: Secondary | ICD-10-CM | POA: Diagnosis not present

## 2015-10-06 DIAGNOSIS — M542 Cervicalgia: Secondary | ICD-10-CM | POA: Diagnosis not present

## 2015-10-13 DIAGNOSIS — Z130 Encounter for screening for diseases of the blood and blood-forming organs and certain disorders involving the immune mechanism: Secondary | ICD-10-CM | POA: Diagnosis not present

## 2015-10-13 DIAGNOSIS — R946 Abnormal results of thyroid function studies: Secondary | ICD-10-CM | POA: Diagnosis not present

## 2015-10-13 DIAGNOSIS — Z6826 Body mass index (BMI) 26.0-26.9, adult: Secondary | ICD-10-CM | POA: Diagnosis not present

## 2015-10-13 DIAGNOSIS — Z1329 Encounter for screening for other suspected endocrine disorder: Secondary | ICD-10-CM | POA: Diagnosis not present

## 2015-10-13 DIAGNOSIS — Z1389 Encounter for screening for other disorder: Secondary | ICD-10-CM | POA: Diagnosis not present

## 2015-10-13 DIAGNOSIS — Z01419 Encounter for gynecological examination (general) (routine) without abnormal findings: Secondary | ICD-10-CM | POA: Diagnosis not present

## 2015-10-18 ENCOUNTER — Ambulatory Visit
Admission: RE | Admit: 2015-10-18 | Discharge: 2015-10-18 | Disposition: A | Discharge status: Home / Self Care | Payer: BLUE CROSS/BLUE SHIELD | Source: Ambulatory Visit | Attending: Family Medicine | Admitting: Family Medicine

## 2015-10-18 ENCOUNTER — Other Ambulatory Visit: Payer: Self-pay | Admitting: Family Medicine

## 2015-10-18 DIAGNOSIS — M542 Cervicalgia: Secondary | ICD-10-CM

## 2015-10-18 DIAGNOSIS — M50223 Other cervical disc displacement at C6-C7 level: Secondary | ICD-10-CM | POA: Diagnosis not present

## 2015-10-18 MED ORDER — GADOBENATE DIMEGLUMINE 529 MG/ML IV SOLN
13.0000 mL | Freq: Once | INTRAVENOUS | Status: AC | PRN
  Administered 2015-10-18: 13 mL via INTRAVENOUS

## 2015-12-31 DIAGNOSIS — Z23 Encounter for immunization: Secondary | ICD-10-CM | POA: Diagnosis not present

## 2015-12-31 DIAGNOSIS — E559 Vitamin D deficiency, unspecified: Secondary | ICD-10-CM | POA: Diagnosis not present

## 2015-12-31 DIAGNOSIS — Z0000 Encounter for general adult medical examination without abnormal findings: Secondary | ICD-10-CM | POA: Diagnosis not present

## 2015-12-31 DIAGNOSIS — R946 Abnormal results of thyroid function studies: Secondary | ICD-10-CM | POA: Diagnosis not present

## 2016-02-11 DIAGNOSIS — D239 Other benign neoplasm of skin, unspecified: Secondary | ICD-10-CM | POA: Diagnosis not present

## 2016-02-11 DIAGNOSIS — L821 Other seborrheic keratosis: Secondary | ICD-10-CM | POA: Diagnosis not present

## 2016-07-31 DIAGNOSIS — R350 Frequency of micturition: Secondary | ICD-10-CM | POA: Diagnosis not present

## 2016-07-31 DIAGNOSIS — N390 Urinary tract infection, site not specified: Secondary | ICD-10-CM | POA: Diagnosis not present

## 2016-08-28 DIAGNOSIS — N390 Urinary tract infection, site not specified: Secondary | ICD-10-CM | POA: Diagnosis not present

## 2016-08-28 DIAGNOSIS — R102 Pelvic and perineal pain: Secondary | ICD-10-CM | POA: Diagnosis not present

## 2016-09-20 DIAGNOSIS — M546 Pain in thoracic spine: Secondary | ICD-10-CM | POA: Diagnosis not present

## 2016-09-21 DIAGNOSIS — M549 Dorsalgia, unspecified: Secondary | ICD-10-CM | POA: Diagnosis not present

## 2016-09-21 DIAGNOSIS — M545 Low back pain: Secondary | ICD-10-CM | POA: Diagnosis not present

## 2016-10-24 DIAGNOSIS — Z6826 Body mass index (BMI) 26.0-26.9, adult: Secondary | ICD-10-CM | POA: Diagnosis not present

## 2016-10-24 DIAGNOSIS — N926 Irregular menstruation, unspecified: Secondary | ICD-10-CM | POA: Diagnosis not present

## 2016-10-24 DIAGNOSIS — Z01419 Encounter for gynecological examination (general) (routine) without abnormal findings: Secondary | ICD-10-CM | POA: Diagnosis not present

## 2016-10-24 DIAGNOSIS — Z1151 Encounter for screening for human papillomavirus (HPV): Secondary | ICD-10-CM | POA: Diagnosis not present

## 2017-02-09 DIAGNOSIS — D229 Melanocytic nevi, unspecified: Secondary | ICD-10-CM | POA: Diagnosis not present

## 2017-02-09 DIAGNOSIS — L821 Other seborrheic keratosis: Secondary | ICD-10-CM | POA: Diagnosis not present

## 2017-04-27 DIAGNOSIS — Z0000 Encounter for general adult medical examination without abnormal findings: Secondary | ICD-10-CM | POA: Diagnosis not present

## 2017-04-27 DIAGNOSIS — E663 Overweight: Secondary | ICD-10-CM | POA: Diagnosis not present

## 2017-04-27 DIAGNOSIS — E559 Vitamin D deficiency, unspecified: Secondary | ICD-10-CM | POA: Diagnosis not present

## 2017-04-27 DIAGNOSIS — R946 Abnormal results of thyroid function studies: Secondary | ICD-10-CM | POA: Diagnosis not present

## 2017-04-27 DIAGNOSIS — Z13220 Encounter for screening for lipoid disorders: Secondary | ICD-10-CM | POA: Diagnosis not present

## 2017-07-04 DIAGNOSIS — J3489 Other specified disorders of nose and nasal sinuses: Secondary | ICD-10-CM | POA: Diagnosis not present

## 2017-11-20 DIAGNOSIS — Z1231 Encounter for screening mammogram for malignant neoplasm of breast: Secondary | ICD-10-CM | POA: Diagnosis not present

## 2017-11-20 DIAGNOSIS — Z01419 Encounter for gynecological examination (general) (routine) without abnormal findings: Secondary | ICD-10-CM | POA: Diagnosis not present

## 2017-11-20 DIAGNOSIS — Z6826 Body mass index (BMI) 26.0-26.9, adult: Secondary | ICD-10-CM | POA: Diagnosis not present

## 2017-11-21 ENCOUNTER — Other Ambulatory Visit: Payer: Self-pay | Admitting: Obstetrics and Gynecology

## 2017-11-21 DIAGNOSIS — N6320 Unspecified lump in the left breast, unspecified quadrant: Secondary | ICD-10-CM

## 2017-11-30 ENCOUNTER — Ambulatory Visit
Admission: RE | Admit: 2017-11-30 | Discharge: 2017-11-30 | Disposition: A | Discharge status: Home / Self Care | Payer: BLUE CROSS/BLUE SHIELD | Source: Ambulatory Visit | Attending: Obstetrics and Gynecology | Admitting: Obstetrics and Gynecology

## 2017-11-30 DIAGNOSIS — N6320 Unspecified lump in the left breast, unspecified quadrant: Secondary | ICD-10-CM

## 2017-11-30 DIAGNOSIS — N6002 Solitary cyst of left breast: Secondary | ICD-10-CM | POA: Diagnosis not present

## 2017-12-18 DIAGNOSIS — R946 Abnormal results of thyroid function studies: Secondary | ICD-10-CM | POA: Diagnosis not present

## 2017-12-18 DIAGNOSIS — N920 Excessive and frequent menstruation with regular cycle: Secondary | ICD-10-CM | POA: Diagnosis not present

## 2017-12-18 DIAGNOSIS — N926 Irregular menstruation, unspecified: Secondary | ICD-10-CM | POA: Diagnosis not present

## 2017-12-19 DIAGNOSIS — Z23 Encounter for immunization: Secondary | ICD-10-CM | POA: Diagnosis not present

## 2017-12-26 DIAGNOSIS — E663 Overweight: Secondary | ICD-10-CM | POA: Diagnosis not present

## 2017-12-26 DIAGNOSIS — R946 Abnormal results of thyroid function studies: Secondary | ICD-10-CM | POA: Diagnosis not present

## 2017-12-26 DIAGNOSIS — Z6825 Body mass index (BMI) 25.0-25.9, adult: Secondary | ICD-10-CM | POA: Diagnosis not present

## 2017-12-26 DIAGNOSIS — E559 Vitamin D deficiency, unspecified: Secondary | ICD-10-CM | POA: Diagnosis not present

## 2018-02-08 DIAGNOSIS — L570 Actinic keratosis: Secondary | ICD-10-CM | POA: Diagnosis not present

## 2018-02-08 DIAGNOSIS — D229 Melanocytic nevi, unspecified: Secondary | ICD-10-CM | POA: Diagnosis not present

## 2018-03-12 DIAGNOSIS — E559 Vitamin D deficiency, unspecified: Secondary | ICD-10-CM | POA: Diagnosis not present

## 2018-03-12 DIAGNOSIS — R946 Abnormal results of thyroid function studies: Secondary | ICD-10-CM | POA: Diagnosis not present

## 2018-03-13 DIAGNOSIS — E039 Hypothyroidism, unspecified: Secondary | ICD-10-CM | POA: Diagnosis not present

## 2018-03-13 DIAGNOSIS — R946 Abnormal results of thyroid function studies: Secondary | ICD-10-CM | POA: Diagnosis not present

## 2018-03-13 DIAGNOSIS — E559 Vitamin D deficiency, unspecified: Secondary | ICD-10-CM | POA: Diagnosis not present

## 2018-05-21 DIAGNOSIS — E559 Vitamin D deficiency, unspecified: Secondary | ICD-10-CM | POA: Diagnosis not present

## 2018-05-21 DIAGNOSIS — E785 Hyperlipidemia, unspecified: Secondary | ICD-10-CM | POA: Diagnosis not present

## 2018-05-21 DIAGNOSIS — E039 Hypothyroidism, unspecified: Secondary | ICD-10-CM | POA: Diagnosis not present

## 2018-05-21 DIAGNOSIS — Z131 Encounter for screening for diabetes mellitus: Secondary | ICD-10-CM | POA: Diagnosis not present

## 2018-05-23 DIAGNOSIS — Z0000 Encounter for general adult medical examination without abnormal findings: Secondary | ICD-10-CM | POA: Diagnosis not present

## 2018-12-16 DIAGNOSIS — Z1231 Encounter for screening mammogram for malignant neoplasm of breast: Secondary | ICD-10-CM | POA: Diagnosis not present

## 2018-12-16 DIAGNOSIS — Z01419 Encounter for gynecological examination (general) (routine) without abnormal findings: Secondary | ICD-10-CM | POA: Diagnosis not present

## 2018-12-16 DIAGNOSIS — Z6825 Body mass index (BMI) 25.0-25.9, adult: Secondary | ICD-10-CM | POA: Diagnosis not present

## 2019-01-20 IMAGING — US ULTRASOUND LEFT BREAST LIMITED
1 series · 5 of 5 positions shown · non-contrast
Comparison: Previous exam(s).

CLINICAL DATA: The patient was called back from screening
mammography due to a left breast mass.

EXAM:
ULTRASOUND OF THE LEFT BREAST

[Series 1: ultrasound left breast limited · 0.07mm/px · 5 of 5 slices shown]
[im 1/5]
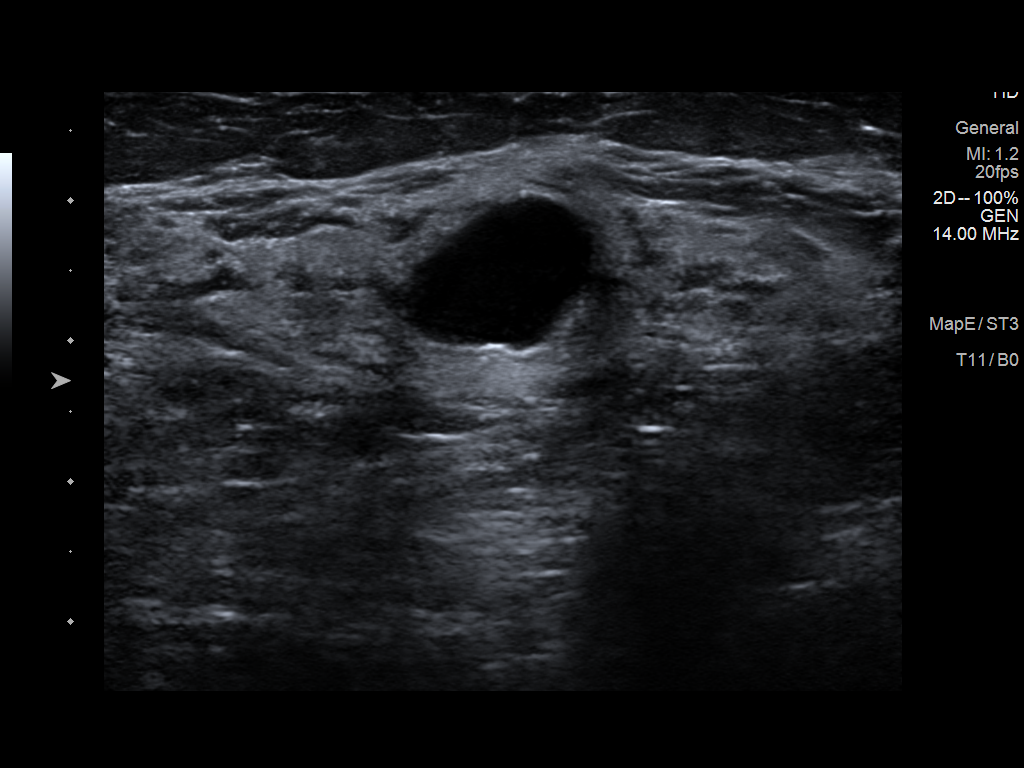
[im 2/5]
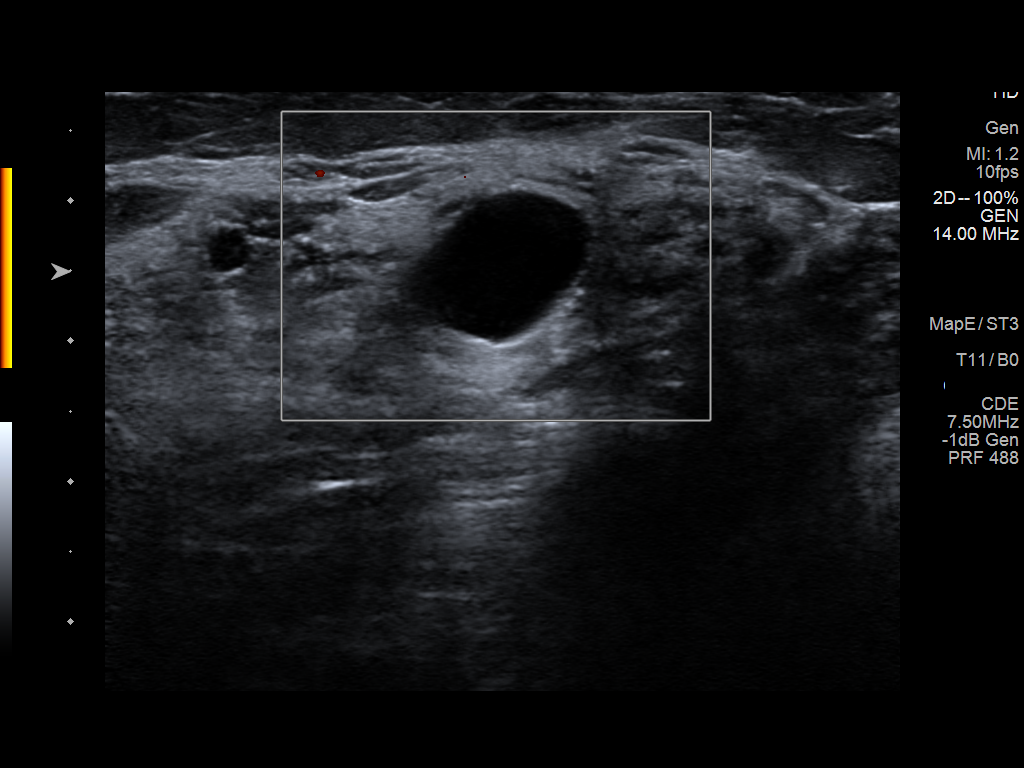
[im 3/5]
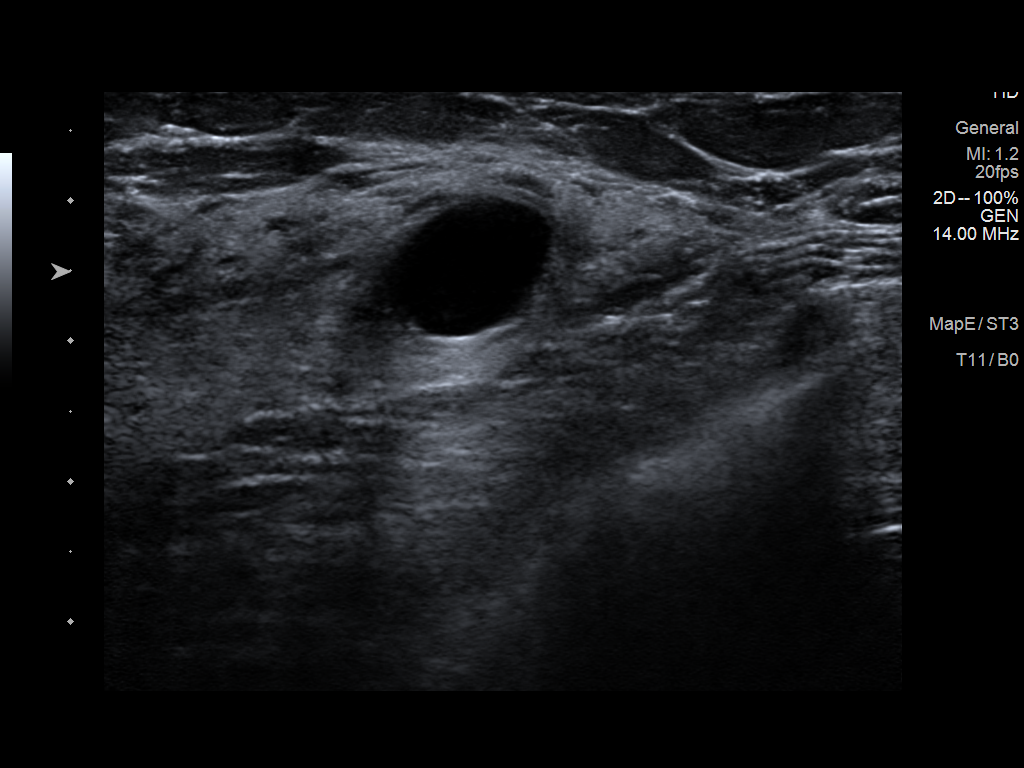
[im 4/5]
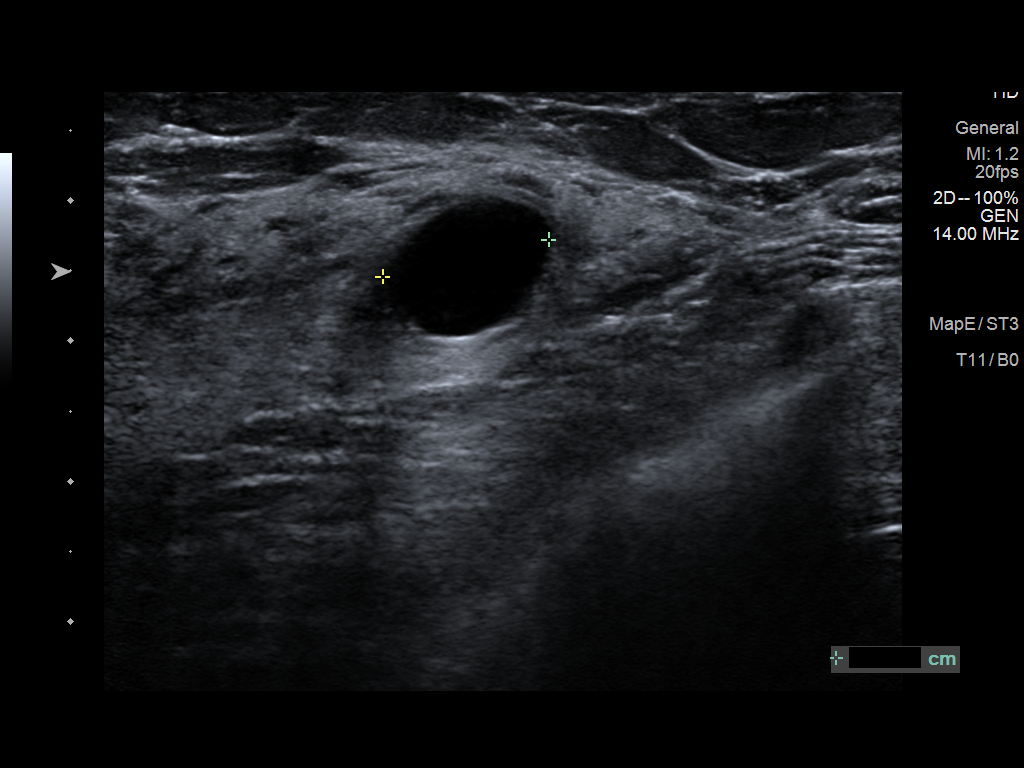
[im 5/5]
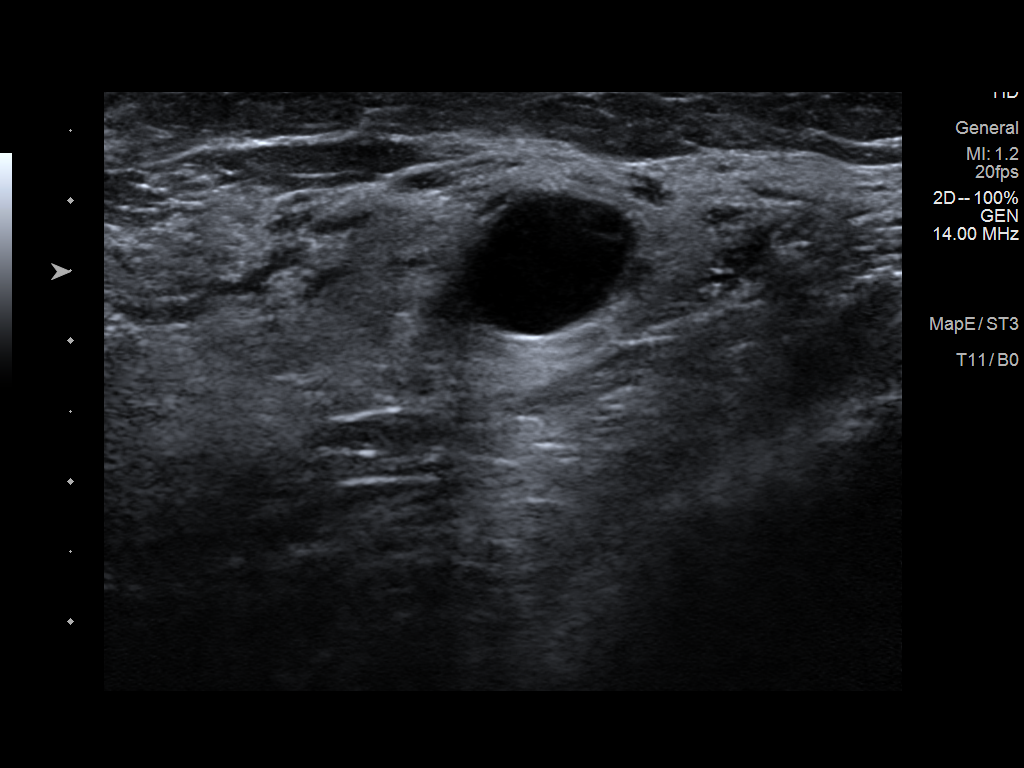

[5 of 5 positions shown; findings below may reference images not displayed]

FINDINGS: On physical exam, no suspicious lumps are identified.

Targeted ultrasound is performed, showing a simple cyst in the left
breast at [DATE], 7 cm from the nipple correlating with the
mammographically identified mass.
IMPRESSION: Fibrocystic changes.  No evidence of malignancy.

RECOMMENDATION:
Annual screening mammography.

I have discussed the findings and recommendations with the patient.
Results were also provided in writing at the conclusion of the
visit. If applicable, a reminder letter will be sent to the patient
regarding the next appointment.

BI-RADS CATEGORY  2: Benign.

## 2019-06-04 DIAGNOSIS — Z13220 Encounter for screening for lipoid disorders: Secondary | ICD-10-CM | POA: Diagnosis not present

## 2019-06-04 DIAGNOSIS — E559 Vitamin D deficiency, unspecified: Secondary | ICD-10-CM | POA: Diagnosis not present

## 2019-06-04 DIAGNOSIS — Z131 Encounter for screening for diabetes mellitus: Secondary | ICD-10-CM | POA: Diagnosis not present

## 2019-06-04 DIAGNOSIS — E039 Hypothyroidism, unspecified: Secondary | ICD-10-CM | POA: Diagnosis not present

## 2019-06-09 DIAGNOSIS — Z0000 Encounter for general adult medical examination without abnormal findings: Secondary | ICD-10-CM | POA: Diagnosis not present

## 2019-08-08 ENCOUNTER — Ambulatory Visit: Payer: BLUE CROSS/BLUE SHIELD | Admitting: Physician Assistant

## 2019-08-22 ENCOUNTER — Encounter: Payer: Self-pay | Admitting: Physician Assistant

## 2019-08-22 ENCOUNTER — Ambulatory Visit (INDEPENDENT_AMBULATORY_CARE_PROVIDER_SITE_OTHER): Payer: BC Managed Care – PPO | Admitting: Physician Assistant

## 2019-08-22 ENCOUNTER — Other Ambulatory Visit: Payer: Self-pay

## 2019-08-22 DIAGNOSIS — Z1283 Encounter for screening for malignant neoplasm of skin: Secondary | ICD-10-CM | POA: Diagnosis not present

## 2019-08-22 DIAGNOSIS — Z86018 Personal history of other benign neoplasm: Secondary | ICD-10-CM | POA: Diagnosis not present

## 2019-08-22 DIAGNOSIS — L219 Seborrheic dermatitis, unspecified: Secondary | ICD-10-CM | POA: Diagnosis not present

## 2019-08-22 MED ORDER — KETOCONAZOLE 2 % EX CREA: 1.0000 "application " | TOPICAL_CREAM | Freq: Two times a day (BID) | CUTANEOUS | 0 refills | Status: AC

## 2019-08-22 MED ORDER — SULCONAZOLE NITRATE 1 % EX CREA: TOPICAL_CREAM | Freq: Every day | CUTANEOUS | 0 refills | Status: DC

## 2019-08-22 NOTE — Addendum Note (Signed)
Addended by: Sheran Lawless on: 08/22/2019 02:55 PM   Modules accepted: Orders

## 2019-08-22 NOTE — Progress Notes (Signed)
   Follow-Up Visit   Subjective  Jasmin Murphy is a 44 y.o. female who presents for the following: Annual Exam (MORE SPOTS IN SCALP X MONTHS KERATOSES?).   The following portions of the chart were reviewed this encounter and updated as appropriate: Tobacco  Allergies  Meds  Problems  Med Hx  Surg Hx  Fam Hx      Objective  Well appearing patient in no apparent distress; mood and affect are within normal limits.  A full examination was performed including scalp, head, eyes, ears, nose, lips, neck, chest, axillae, abdomen, back, buttocks, bilateral upper extremities, bilateral lower extremities, hands, feet, fingers, toes, fingernails, and toenails. All findings within normal limits unless otherwise noted below.  Objective  Left Supraclavicular Area: Scars clear  Objective  head to toe: No Signs of NMSC  Objective  Mid Parietal Scalp: Erythematous plaques with greasy scale.   Assessment & Plan  History of dysplastic nevus Left Supraclavicular Area  Skin exam  Screening exam for skin cancer head to toe  Seborrheic dermatitis Mid Parietal Scalp  Exelderm lotion

## 2019-12-31 DIAGNOSIS — Z01419 Encounter for gynecological examination (general) (routine) without abnormal findings: Secondary | ICD-10-CM | POA: Diagnosis not present

## 2019-12-31 DIAGNOSIS — Z6828 Body mass index (BMI) 28.0-28.9, adult: Secondary | ICD-10-CM | POA: Diagnosis not present

## 2020-01-24 DIAGNOSIS — Z20822 Contact with and (suspected) exposure to covid-19: Secondary | ICD-10-CM | POA: Diagnosis not present

## 2020-06-22 DIAGNOSIS — E559 Vitamin D deficiency, unspecified: Secondary | ICD-10-CM | POA: Diagnosis not present

## 2020-06-22 DIAGNOSIS — E039 Hypothyroidism, unspecified: Secondary | ICD-10-CM | POA: Diagnosis not present

## 2020-06-29 DIAGNOSIS — Z0000 Encounter for general adult medical examination without abnormal findings: Secondary | ICD-10-CM | POA: Diagnosis not present

## 2020-07-08 DIAGNOSIS — N912 Amenorrhea, unspecified: Secondary | ICD-10-CM | POA: Diagnosis not present

## 2020-07-12 DIAGNOSIS — Z3490 Encounter for supervision of normal pregnancy, unspecified, unspecified trimester: Secondary | ICD-10-CM | POA: Diagnosis not present

## 2020-08-09 DIAGNOSIS — N912 Amenorrhea, unspecified: Secondary | ICD-10-CM | POA: Diagnosis not present

## 2020-08-09 DIAGNOSIS — N911 Secondary amenorrhea: Secondary | ICD-10-CM | POA: Diagnosis not present

## 2020-08-09 DIAGNOSIS — R946 Abnormal results of thyroid function studies: Secondary | ICD-10-CM | POA: Diagnosis not present

## 2020-08-10 DIAGNOSIS — N912 Amenorrhea, unspecified: Secondary | ICD-10-CM | POA: Diagnosis not present

## 2020-08-10 DIAGNOSIS — O039 Complete or unspecified spontaneous abortion without complication: Secondary | ICD-10-CM | POA: Diagnosis not present

## 2020-08-10 DIAGNOSIS — N926 Irregular menstruation, unspecified: Secondary | ICD-10-CM | POA: Diagnosis not present

## 2020-08-18 DIAGNOSIS — Z1231 Encounter for screening mammogram for malignant neoplasm of breast: Secondary | ICD-10-CM | POA: Diagnosis not present

## 2020-09-02 ENCOUNTER — Encounter: Payer: Self-pay | Admitting: Physician Assistant

## 2020-09-02 ENCOUNTER — Ambulatory Visit: Payer: BC Managed Care – PPO | Admitting: Physician Assistant

## 2020-09-02 ENCOUNTER — Other Ambulatory Visit: Payer: Self-pay

## 2020-09-02 DIAGNOSIS — Z1283 Encounter for screening for malignant neoplasm of skin: Secondary | ICD-10-CM | POA: Diagnosis not present

## 2020-09-02 DIAGNOSIS — C4441 Basal cell carcinoma of skin of scalp and neck: Secondary | ICD-10-CM | POA: Diagnosis not present

## 2020-09-02 DIAGNOSIS — Z86018 Personal history of other benign neoplasm: Secondary | ICD-10-CM | POA: Diagnosis not present

## 2020-09-02 DIAGNOSIS — D485 Neoplasm of uncertain behavior of skin: Secondary | ICD-10-CM

## 2020-09-02 NOTE — Patient Instructions (Signed)

## 2020-09-02 NOTE — Progress Notes (Signed)
   Follow-Up Visit   Subjective  Jasmin Murphy is a 45 y.o. female who presents for the following: Annual Exam (Patient here today for yearly skin check, no concerns. Personal history of atypical moles. No personal history of melanoma of non mole skin cancer. No family history of atypical moles, melanoma or non mole skin cancer. ).New pink bump popped up on her head x a few months. No pain, itch or bleeding.   The following portions of the chart were reviewed this encounter and updated as appropriate:  Tobacco  Allergies  Meds  Problems  Med Hx  Surg Hx  Fam Hx       Objective  Well appearing patient in no apparent distress; mood and affect are within normal limits.  A full examination was performed including scalp, head, eyes, ears, nose, lips, neck, chest, axillae, abdomen, back, buttocks, bilateral upper extremities, bilateral lower extremities, hands, feet, fingers, toes, fingernails, and toenails. All findings within normal limits unless otherwise noted below.  Mid Frontal Scalp Pink pearly papule      Assessment & Plan  Neoplasm of uncertain behavior of skin Mid Frontal Scalp  Skin / nail biopsy Type of biopsy: tangential   Informed consent: discussed and consent obtained   Timeout: patient name, date of birth, surgical site, and procedure verified   Procedure prep:  Patient was prepped and draped in usual sterile fashion (Non sterile) Prep type:  Chlorhexidine Anesthesia: the lesion was anesthetized in a standard fashion   Anesthetic:  1% lidocaine w/ epinephrine 1-100,000 local infiltration Instrument used: flexible razor blade   Outcome: patient tolerated procedure well   Post-procedure details: wound care instructions given    Specimen 1 - Surgical pathology Differential Diagnosis: bcc vs scc  Check Margins: No    I, Nahdia Doucet, PA-C, have reviewed all documentation's for this visit.  The documentation on 09/02/20 for the exam, diagnosis,  procedures and orders are all accurate and complete.

## 2020-09-15 ENCOUNTER — Telehealth: Payer: Self-pay | Admitting: *Deleted

## 2020-09-15 NOTE — Telephone Encounter (Signed)
Path report to patient. Referral sent via proficient.

## 2020-09-15 NOTE — Telephone Encounter (Signed)
-----   Message from Warren Danes, [place] sent at 09/14/2020  2:47 PM EDT ----- mohs ----- Message ----- From: Interface, Lab In [facility] Sent: 09/10/2020   6:12 PM EDT To: Warren Danes, PA-C

## 2020-09-15 NOTE — Telephone Encounter (Signed)
Left patient a message to call back for pathology reports.

## 2020-09-15 NOTE — Telephone Encounter (Signed)
Patient LM on our VM returning call from nurse.

## 2020-11-09 DIAGNOSIS — N911 Secondary amenorrhea: Secondary | ICD-10-CM | POA: Diagnosis not present

## 2020-11-10 DIAGNOSIS — Z3041 Encounter for surveillance of contraceptive pills: Secondary | ICD-10-CM | POA: Diagnosis not present

## 2020-11-10 DIAGNOSIS — N926 Irregular menstruation, unspecified: Secondary | ICD-10-CM | POA: Diagnosis not present

## 2020-11-10 DIAGNOSIS — R7989 Other specified abnormal findings of blood chemistry: Secondary | ICD-10-CM | POA: Diagnosis not present

## 2020-11-18 DIAGNOSIS — C4441 Basal cell carcinoma of skin of scalp and neck: Secondary | ICD-10-CM | POA: Diagnosis not present

## 2021-01-04 DIAGNOSIS — Z6826 Body mass index (BMI) 26.0-26.9, adult: Secondary | ICD-10-CM | POA: Diagnosis not present

## 2021-01-04 DIAGNOSIS — R3915 Urgency of urination: Secondary | ICD-10-CM | POA: Diagnosis not present

## 2021-01-04 DIAGNOSIS — Z124 Encounter for screening for malignant neoplasm of cervix: Secondary | ICD-10-CM | POA: Diagnosis not present

## 2021-01-04 DIAGNOSIS — R87610 Atypical squamous cells of undetermined significance on cytologic smear of cervix (ASC-US): Secondary | ICD-10-CM | POA: Diagnosis not present

## 2021-01-04 DIAGNOSIS — Z01419 Encounter for gynecological examination (general) (routine) without abnormal findings: Secondary | ICD-10-CM | POA: Diagnosis not present

## 2021-02-09 DIAGNOSIS — Z3202 Encounter for pregnancy test, result negative: Secondary | ICD-10-CM | POA: Diagnosis not present

## 2021-02-09 DIAGNOSIS — R87610 Atypical squamous cells of undetermined significance on cytologic smear of cervix (ASC-US): Secondary | ICD-10-CM | POA: Diagnosis not present

## 2021-03-14 ENCOUNTER — Ambulatory Visit: Payer: BC Managed Care – PPO | Admitting: Physician Assistant

## 2021-03-30 ENCOUNTER — Ambulatory Visit: Payer: BC Managed Care – PPO | Admitting: Physician Assistant

## 2021-04-07 ENCOUNTER — Ambulatory Visit: Payer: BC Managed Care – PPO | Admitting: Physician Assistant

## 2021-04-07 ENCOUNTER — Encounter: Payer: Self-pay | Admitting: Physician Assistant

## 2021-04-07 ENCOUNTER — Other Ambulatory Visit: Payer: Self-pay

## 2021-04-07 DIAGNOSIS — L570 Actinic keratosis: Secondary | ICD-10-CM | POA: Diagnosis not present

## 2021-04-07 NOTE — Progress Notes (Signed)
° °  Follow-Up Visit   Subjective  Jasmin Murphy is a 46 y.o. female who presents for the following: Follow-up (Few scaly spots on face- wants checked & f/u MOHS on left scalp- no concerns). Her scalp healed well but she indicated that it was painful and took a long time to heal.    The following portions of the chart were reviewed this encounter and updated as appropriate:  Tobacco   Allergies   Meds   Problems   Med Hx   Surg Hx   Fam Hx       Objective  Well appearing patient in no apparent distress; mood and affect are within normal limits.  A focused examination was performed including face and scalp. Relevant physical exam findings are noted in the Assessment and Plan.  Right Frontal Scalp Erythematous patches with gritty scale.   Assessment & Plan  AK (actinic keratosis) Right Frontal Scalp  Destruction of lesion - Right Frontal Scalp Complexity: simple   Destruction method: cryotherapy   Informed consent: discussed and consent obtained   Timeout:  patient name, date of birth, surgical site, and procedure verified Lesion destroyed using liquid nitrogen: Yes   Cryotherapy cycles:  3 Outcome: patient tolerated procedure well with no complications    Moh's surgical site was clear and smooth with enlarged blood vessels and lack of hair in the surgical area. Return to clinic if recurs.   I, Payge Eppes, PA-C, have reviewed all documentation's for this visit.  The documentation on 04/07/21 for the exam, diagnosis, procedures and orders are all accurate and complete.

## 2021-09-26 DIAGNOSIS — E039 Hypothyroidism, unspecified: Secondary | ICD-10-CM | POA: Diagnosis not present

## 2021-09-26 DIAGNOSIS — Z0000 Encounter for general adult medical examination without abnormal findings: Secondary | ICD-10-CM | POA: Diagnosis not present

## 2021-09-26 DIAGNOSIS — E559 Vitamin D deficiency, unspecified: Secondary | ICD-10-CM | POA: Diagnosis not present

## 2021-10-05 ENCOUNTER — Ambulatory Visit: Payer: BC Managed Care – PPO | Admitting: Physician Assistant

## 2021-10-05 ENCOUNTER — Encounter: Payer: Self-pay | Admitting: Physician Assistant

## 2021-10-05 DIAGNOSIS — Z1283 Encounter for screening for malignant neoplasm of skin: Secondary | ICD-10-CM

## 2021-10-05 DIAGNOSIS — Z86018 Personal history of other benign neoplasm: Secondary | ICD-10-CM

## 2021-10-05 DIAGNOSIS — C441192 Basal cell carcinoma of skin of left lower eyelid, including canthus: Secondary | ICD-10-CM | POA: Diagnosis not present

## 2021-10-05 DIAGNOSIS — D485 Neoplasm of uncertain behavior of skin: Secondary | ICD-10-CM

## 2021-10-05 DIAGNOSIS — C44111 Basal cell carcinoma of skin of unspecified eyelid, including canthus: Secondary | ICD-10-CM | POA: Diagnosis not present

## 2021-10-05 DIAGNOSIS — C4491 Basal cell carcinoma of skin, unspecified: Secondary | ICD-10-CM

## 2021-10-05 HISTORY — DX: Basal cell carcinoma of skin, unspecified: C44.91

## 2021-10-05 NOTE — Progress Notes (Signed)
   Follow-Up Visit   Subjective  Jasmin Murphy is a 46 y.o. female who presents for the following: Follow-up (Follow up for more liquid nitrogen. Right brow is a new lesion she feels x months. Personal history of atypia and BCC so she wants a full body skin check. ).   The following portions of the chart were reviewed this encounter and updated as appropriate:  Tobacco  Allergies  Meds  Problems  Med Hx  Surg Hx  Fam Hx      Objective  Well appearing patient in no apparent distress; mood and affect are within normal limits.  A full examination was performed including scalp, head, eyes, ears, nose, lips, neck, chest, axillae, abdomen, back, buttocks, bilateral upper extremities, bilateral lower extremities, hands, feet, fingers, toes, fingernails, and toenails. All findings within normal limits unless otherwise noted below.  Full body skin examination- No atypical nevi  noted at the time of the visit.   Left Medial Canthus Pearly papule with telangectasia      Assessment & Plan  Encounter for screening for malignant neoplasm of skin  Yearly skin examination   Neoplasm of uncertain behavior of skin Left Medial Canthus  Skin / nail biopsy Type of biopsy: tangential   Informed consent: discussed and consent obtained   Timeout: patient name, date of birth, surgical site, and procedure verified   Procedure prep:  Patient was prepped and draped in usual sterile fashion (Non sterile) Prep type:  Chlorhexidine Anesthesia: the lesion was anesthetized in a standard fashion   Anesthetic:  1% lidocaine w/ epinephrine 1-100,000 local infiltration Instrument used: flexible razor blade   Outcome: patient tolerated procedure well   Post-procedure details: wound care instructions given    Specimen 1 - Surgical pathology Differential Diagnosis: bcc vs scc  Check Margins: yes    I, Wali Reinheimer, PA-C, have reviewed all documentation's for this visit.  The documentation on  10/05/21 for the exam, diagnosis, procedures and orders are all accurate and complete.

## 2021-10-05 NOTE — Patient Instructions (Signed)

## 2021-10-11 ENCOUNTER — Telehealth: Payer: Self-pay | Admitting: Physician Assistant

## 2021-10-11 ENCOUNTER — Encounter: Payer: Self-pay | Admitting: Physician Assistant

## 2021-10-11 NOTE — Telephone Encounter (Signed)
Phone call to [facility], PA-C regarding patient's results and to get a verbal for treatment. Per Robyne Askew, PA-C refer patient to Dr. Link Snuffer for [facility] he will coordinate with Dr. Isidoro Donning for closure.  Patient aware of pathology results and [facility] recommendations. MOH's referral sent to [facility].

## 2021-10-11 NOTE — Telephone Encounter (Signed)
Patient left message on office voice mail that last week she was told that her biopsy specimen was sent in as "stat" and that she would have the results by the end of the week.  Patient states that she has not heard anything about the results and there is nothing in MyChart.

## 2021-11-03 DIAGNOSIS — C441192 Basal cell carcinoma of skin of left lower eyelid, including canthus: Secondary | ICD-10-CM | POA: Diagnosis not present

## 2021-12-22 DIAGNOSIS — C441192 Basal cell carcinoma of skin of left lower eyelid, including canthus: Secondary | ICD-10-CM | POA: Diagnosis not present

## 2022-01-05 DIAGNOSIS — S01102A Unspecified open wound of left eyelid and periocular area, initial encounter: Secondary | ICD-10-CM | POA: Diagnosis not present

## 2022-01-26 DIAGNOSIS — Z01419 Encounter for gynecological examination (general) (routine) without abnormal findings: Secondary | ICD-10-CM | POA: Diagnosis not present

## 2022-01-26 DIAGNOSIS — Z6825 Body mass index (BMI) 25.0-25.9, adult: Secondary | ICD-10-CM | POA: Diagnosis not present

## 2022-01-26 DIAGNOSIS — Z01411 Encounter for gynecological examination (general) (routine) with abnormal findings: Secondary | ICD-10-CM | POA: Diagnosis not present

## 2022-01-26 DIAGNOSIS — Z1231 Encounter for screening mammogram for malignant neoplasm of breast: Secondary | ICD-10-CM | POA: Diagnosis not present

## 2022-01-26 DIAGNOSIS — Z124 Encounter for screening for malignant neoplasm of cervix: Secondary | ICD-10-CM | POA: Diagnosis not present

## 2022-01-26 DIAGNOSIS — R300 Dysuria: Secondary | ICD-10-CM | POA: Diagnosis not present

## 2022-01-31 DIAGNOSIS — R319 Hematuria, unspecified: Secondary | ICD-10-CM | POA: Diagnosis not present

## 2022-03-30 DIAGNOSIS — D229 Melanocytic nevi, unspecified: Secondary | ICD-10-CM | POA: Diagnosis not present

## 2022-03-30 DIAGNOSIS — L814 Other melanin hyperpigmentation: Secondary | ICD-10-CM | POA: Diagnosis not present

## 2022-03-30 DIAGNOSIS — Z08 Encounter for follow-up examination after completed treatment for malignant neoplasm: Secondary | ICD-10-CM | POA: Diagnosis not present

## 2022-03-30 DIAGNOSIS — L821 Other seborrheic keratosis: Secondary | ICD-10-CM | POA: Diagnosis not present

## 2022-06-19 DIAGNOSIS — R221 Localized swelling, mass and lump, neck: Secondary | ICD-10-CM | POA: Diagnosis not present

## 2022-07-05 DIAGNOSIS — E041 Nontoxic single thyroid nodule: Secondary | ICD-10-CM | POA: Diagnosis not present

## 2022-07-05 DIAGNOSIS — R221 Localized swelling, mass and lump, neck: Secondary | ICD-10-CM | POA: Diagnosis not present

## 2022-07-18 DIAGNOSIS — E041 Nontoxic single thyroid nodule: Secondary | ICD-10-CM | POA: Diagnosis not present

## 2022-07-18 DIAGNOSIS — E559 Vitamin D deficiency, unspecified: Secondary | ICD-10-CM | POA: Diagnosis not present

## 2022-07-18 DIAGNOSIS — Z13220 Encounter for screening for lipoid disorders: Secondary | ICD-10-CM | POA: Diagnosis not present

## 2022-07-18 DIAGNOSIS — Z131 Encounter for screening for diabetes mellitus: Secondary | ICD-10-CM | POA: Diagnosis not present

## 2022-07-19 DIAGNOSIS — D2271 Melanocytic nevi of right lower limb, including hip: Secondary | ICD-10-CM | POA: Diagnosis not present

## 2022-10-04 DIAGNOSIS — S70362A Insect bite (nonvenomous), left thigh, initial encounter: Secondary | ICD-10-CM | POA: Diagnosis not present

## 2022-10-04 DIAGNOSIS — L821 Other seborrheic keratosis: Secondary | ICD-10-CM | POA: Diagnosis not present

## 2022-10-04 DIAGNOSIS — L570 Actinic keratosis: Secondary | ICD-10-CM | POA: Diagnosis not present

## 2022-10-04 DIAGNOSIS — D225 Melanocytic nevi of trunk: Secondary | ICD-10-CM | POA: Diagnosis not present

## 2022-10-04 DIAGNOSIS — L814 Other melanin hyperpigmentation: Secondary | ICD-10-CM | POA: Diagnosis not present

## 2022-10-10 DIAGNOSIS — E041 Nontoxic single thyroid nodule: Secondary | ICD-10-CM | POA: Diagnosis not present

## 2022-10-10 DIAGNOSIS — Z0000 Encounter for general adult medical examination without abnormal findings: Secondary | ICD-10-CM | POA: Diagnosis not present

## 2022-10-10 DIAGNOSIS — E039 Hypothyroidism, unspecified: Secondary | ICD-10-CM | POA: Diagnosis not present

## 2022-10-10 DIAGNOSIS — E559 Vitamin D deficiency, unspecified: Secondary | ICD-10-CM | POA: Diagnosis not present

## 2022-12-26 ENCOUNTER — Other Ambulatory Visit: Payer: Self-pay | Admitting: Family Medicine

## 2022-12-26 DIAGNOSIS — E041 Nontoxic single thyroid nodule: Secondary | ICD-10-CM

## 2023-01-01 ENCOUNTER — Ambulatory Visit
Admission: RE | Admit: 2023-01-01 | Discharge: 2023-01-01 | Disposition: A | Discharge status: Home / Self Care | Payer: BC Managed Care – PPO | Source: Ambulatory Visit | Attending: Family Medicine | Admitting: Family Medicine

## 2023-01-01 DIAGNOSIS — E041 Nontoxic single thyroid nodule: Secondary | ICD-10-CM | POA: Diagnosis not present

## 2023-02-14 DIAGNOSIS — Z124 Encounter for screening for malignant neoplasm of cervix: Secondary | ICD-10-CM | POA: Diagnosis not present

## 2023-02-14 DIAGNOSIS — Z1331 Encounter for screening for depression: Secondary | ICD-10-CM | POA: Diagnosis not present

## 2023-02-14 DIAGNOSIS — Z1231 Encounter for screening mammogram for malignant neoplasm of breast: Secondary | ICD-10-CM | POA: Diagnosis not present

## 2023-02-14 DIAGNOSIS — Z01419 Encounter for gynecological examination (general) (routine) without abnormal findings: Secondary | ICD-10-CM | POA: Diagnosis not present

## 2023-02-14 DIAGNOSIS — Z01411 Encounter for gynecological examination (general) (routine) with abnormal findings: Secondary | ICD-10-CM | POA: Diagnosis not present

## 2023-02-14 DIAGNOSIS — R319 Hematuria, unspecified: Secondary | ICD-10-CM | POA: Diagnosis not present

## 2023-04-09 DIAGNOSIS — R8279 Other abnormal findings on microbiological examination of urine: Secondary | ICD-10-CM | POA: Diagnosis not present

## 2023-04-09 DIAGNOSIS — N952 Postmenopausal atrophic vaginitis: Secondary | ICD-10-CM | POA: Diagnosis not present

## 2023-04-16 DIAGNOSIS — D225 Melanocytic nevi of trunk: Secondary | ICD-10-CM | POA: Diagnosis not present

## 2023-04-16 DIAGNOSIS — L821 Other seborrheic keratosis: Secondary | ICD-10-CM | POA: Diagnosis not present

## 2023-04-16 DIAGNOSIS — Z08 Encounter for follow-up examination after completed treatment for malignant neoplasm: Secondary | ICD-10-CM | POA: Diagnosis not present

## 2023-04-16 DIAGNOSIS — L814 Other melanin hyperpigmentation: Secondary | ICD-10-CM | POA: Diagnosis not present

## 2023-04-16 DIAGNOSIS — L570 Actinic keratosis: Secondary | ICD-10-CM | POA: Diagnosis not present

## 2023-10-15 DIAGNOSIS — E039 Hypothyroidism, unspecified: Secondary | ICD-10-CM | POA: Diagnosis not present

## 2023-10-15 DIAGNOSIS — E041 Nontoxic single thyroid nodule: Secondary | ICD-10-CM | POA: Diagnosis not present

## 2023-10-15 DIAGNOSIS — Z0000 Encounter for general adult medical examination without abnormal findings: Secondary | ICD-10-CM | POA: Diagnosis not present

## 2023-10-15 DIAGNOSIS — E559 Vitamin D deficiency, unspecified: Secondary | ICD-10-CM | POA: Diagnosis not present

## 2023-10-22 DIAGNOSIS — Z08 Encounter for follow-up examination after completed treatment for malignant neoplasm: Secondary | ICD-10-CM | POA: Diagnosis not present

## 2023-10-22 DIAGNOSIS — L821 Other seborrheic keratosis: Secondary | ICD-10-CM | POA: Diagnosis not present

## 2023-10-22 DIAGNOSIS — D485 Neoplasm of uncertain behavior of skin: Secondary | ICD-10-CM | POA: Diagnosis not present

## 2023-10-22 DIAGNOSIS — D225 Melanocytic nevi of trunk: Secondary | ICD-10-CM | POA: Diagnosis not present

## 2023-10-22 DIAGNOSIS — L814 Other melanin hyperpigmentation: Secondary | ICD-10-CM | POA: Diagnosis not present

## 2024-02-18 DIAGNOSIS — R87810 Cervical high risk human papillomavirus (HPV) DNA test positive: Secondary | ICD-10-CM | POA: Diagnosis not present

## 2024-02-18 DIAGNOSIS — Z01411 Encounter for gynecological examination (general) (routine) with abnormal findings: Secondary | ICD-10-CM | POA: Diagnosis not present

## 2024-02-18 DIAGNOSIS — Z79890 Hormone replacement therapy (postmenopausal): Secondary | ICD-10-CM | POA: Diagnosis not present

## 2024-02-18 DIAGNOSIS — E28310 Symptomatic premature menopause: Secondary | ICD-10-CM | POA: Diagnosis not present

## 2024-02-18 DIAGNOSIS — Z124 Encounter for screening for malignant neoplasm of cervix: Secondary | ICD-10-CM | POA: Diagnosis not present

## 2024-02-18 DIAGNOSIS — N952 Postmenopausal atrophic vaginitis: Secondary | ICD-10-CM | POA: Diagnosis not present

## 2024-02-18 DIAGNOSIS — Z1231 Encounter for screening mammogram for malignant neoplasm of breast: Secondary | ICD-10-CM | POA: Diagnosis not present
